# Patient Record
Sex: Male | Born: 1995 | Race: Black or African American | Hispanic: No | Marital: Single | Smoking: Current some day smoker
Health system: Southern US, Community
[De-identification: ages and names within clinical notes are randomized; demographics above are authoritative.]

---
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
## 2017-01-14 ENCOUNTER — Encounter (HOSPITAL_BASED_OUTPATIENT_CLINIC_OR_DEPARTMENT_OTHER): Payer: Self-pay | Admitting: *Deleted

## 2017-01-14 ENCOUNTER — Emergency Department (HOSPITAL_BASED_OUTPATIENT_CLINIC_OR_DEPARTMENT_OTHER): Payer: Self-pay

## 2017-01-14 ENCOUNTER — Other Ambulatory Visit: Payer: Self-pay

## 2017-01-14 ENCOUNTER — Emergency Department (HOSPITAL_BASED_OUTPATIENT_CLINIC_OR_DEPARTMENT_OTHER)
Admission: EM | Admit: 2017-01-14 | Discharge: 2017-01-14 | Disposition: A | Discharge status: Home / Self Care | Payer: Self-pay | Attending: Emergency Medicine | Admitting: Emergency Medicine

## 2017-01-14 DIAGNOSIS — N5089 Other specified disorders of the male genital organs: Secondary | ICD-10-CM

## 2017-01-14 DIAGNOSIS — I861 Scrotal varices: Secondary | ICD-10-CM | POA: Insufficient documentation

## 2017-01-14 DIAGNOSIS — N50812 Left testicular pain: Secondary | ICD-10-CM

## 2017-01-14 DIAGNOSIS — F17200 Nicotine dependence, unspecified, uncomplicated: Secondary | ICD-10-CM | POA: Insufficient documentation

## 2017-01-14 LAB — URINALYSIS, ROUTINE W REFLEX MICROSCOPIC
Bilirubin Urine: NEGATIVE
Glucose, UA: NEGATIVE mg/dL
Hgb urine dipstick: NEGATIVE
Ketones, ur: NEGATIVE mg/dL
Leukocytes, UA: NEGATIVE
Nitrite: NEGATIVE
Protein, ur: NEGATIVE mg/dL
Specific Gravity, Urine: 1.015 (ref 1.005–1.030)
pH: 8.5 — ABNORMAL HIGH (ref 5.0–8.0)

## 2017-01-14 NOTE — ED Provider Notes (Signed)
Ultrasound shows small left-sided varicocele that is likely the cause of the patient's symptoms.  However it is minimally bothersome and small.  I discussed he can have urology follow-up but he declines and was told to return if it gets worse.  Results for orders placed or performed during the hospital encounter of 01/14/17  Urinalysis, Routine w reflex microscopic  Result Value Ref Range   Color, Urine YELLOW YELLOW   APPearance CLEAR CLEAR   Specific Gravity, Urine 1.015 1.005 - 1.030   pH 8.5 (H) 5.0 - 8.0   Glucose, UA NEGATIVE NEGATIVE mg/dL   Hgb urine dipstick NEGATIVE NEGATIVE   Bilirubin Urine NEGATIVE NEGATIVE   Ketones, ur NEGATIVE NEGATIVE mg/dL   Protein, ur NEGATIVE NEGATIVE mg/dL   Nitrite NEGATIVE NEGATIVE   Leukocytes, UA NEGATIVE NEGATIVE   [place] Scrotom W/doppler  Result Date: 01/14/2017 CLINICAL DATA:  Left testicular swelling and pain for 1 month EXAM: SCROTAL ULTRASOUND DOPPLER ULTRASOUND OF THE TESTICLES TECHNIQUE: Complete ultrasound examination of the testicles, epididymis, and other scrotal structures was performed. Color and spectral Doppler ultrasound were also utilized to evaluate blood flow to the testicles. COMPARISON:  None. FINDINGS: Right testicle Measurements: 3.7 x 1.8 x 2.4 cm. No mass or microlithiasis visualized. Left testicle Measurements: 3.2 x 1.9 x 2.6 cm. No mass or microlithiasis visualized. Right epididymis:  Normal in size and appearance. Left epididymis:  Normal in size and appearance. Hydrocele:  Small hydroceles are noted bilaterally. Varicocele:  Small left varicocele. Pulsed Doppler interrogation of both testes demonstrates normal low resistance arterial and venous waveforms bilaterally. IMPRESSION: Small left hydrocele.  Normal-appearing testicles. Small bilateral hydrocele. Electronically Signed   By: Alcide CleverMark  Lukens M.D.   On: 01/14/2017 15:51      Pricilla LovelessGoldston, Azhane Eckart, MD 01/14/17 [phone]

## 2017-01-14 NOTE — ED Triage Notes (Signed)
States he noticed a knot in his left testicle a month ago. States it comes and goes.

## 2017-01-14 NOTE — ED Provider Notes (Signed)
[facility] EMERGENCY DEPARTMENT Provider Note   CSN: [id number] Arrival date & time: 01/14/17  1321     History   Chief Complaint Chief Complaint  Patient presents with  . Groin Pain    HPI Terrence DupontHenry Oley is a 21 y.o. male.  HPI   21 year old male presenting with swelling to left testicle.  He reports sometimes painful some was not.  Is been going on for several months.  Patient reports no dysuria, no discharge.  Patient reports unprotected sex but not in the last 6 weeks.  Patient reports no trauma.  No difficulty with urination.  No abdominal pain nausea vomiting or diarrhea.  History reviewed. No pertinent past medical history.  There are no active problems to display for this patient.   History reviewed. No pertinent surgical history.     Home Medications    Prior to Admission medications   Not on File    Family History No family history on file.  Social History Social History   Tobacco Use  . Smoking status: Current Every Day Smoker  . Smokeless tobacco: Never Used  Substance Use Topics  . Alcohol use: No    Frequency: Never  . Drug use: No     Allergies   Patient has no known allergies.   Review of Systems Review of Systems  Constitutional: Negative for activity change.  Respiratory: Negative for shortness of breath.   Cardiovascular: Negative for chest pain.  Gastrointestinal: Negative for abdominal pain.  Genitourinary: Positive for scrotal swelling. Negative for discharge, penile pain and penile swelling.     Physical Exam Updated Vital Signs BP 134/67   Pulse 70   Temp 98.1 F (36.7 C) (Oral)   Resp 20   Ht 5\' 11"  (1.803 m)   Wt 65.8 kg (145 lb)   SpO2 100%   BMI 20.22 kg/m   Physical Exam  Constitutional: He is oriented to person, place, and time. He appears well-nourished.  HENT:  Head: Normocephalic.  Eyes: Conjunctivae are normal.  Cardiovascular: Normal rate.  Pulmonary/Chest: Effort normal.    Abdominal: Soft. He exhibits no distension. There is no tenderness.  Genitourinary: Penis normal.  Genitourinary Comments: Swelling to the top of the last left testicle with mild pain to palpation.  Normal lie.  Neurological: He is oriented to person, place, and time.  Skin: Skin is warm and dry. He is not diaphoretic.  Psychiatric: He has a normal mood and affect. His behavior is normal.     ED Treatments / Results  Labs (all labs ordered are listed, but only abnormal results are displayed) Labs Reviewed  URINALYSIS, ROUTINE W REFLEX MICROSCOPIC - Abnormal; Notable for the following components:      Result Value   pH 8.5 (*)    All other components within normal limits    EKG  EKG Interpretation None       Radiology No results found.  Procedures Procedures (including critical care time)  Medications Ordered in ED Medications - No data to display   Initial Impression / Assessment and Plan / ED Course  I have reviewed the triage vital signs and the nursing notes.  Pertinent labs & imaging results that were available during my care of the patient were reviewed by me and considered in my medical decision making (see chart for details).     21 year old male presenting with swelling to left testicle.  He reports sometimes painful some was not.  Is been going on for several  months.  Patient reports no dysuria, no discharge.  Patient reports unprotected sex but not in the last 6 weeks.  Patient reports no trauma.  No difficulty with urination.  No abdominal pain nausea vomiting or diarrhea.  Spermatocele versus varicocele.  Versus hydrocele  Get ultrasound.  Do not suspect any kind of infection  Final Clinical Impressions(s) / ED Diagnoses   Final diagnoses:  None    ED Discharge Orders    None       Abelino DerrickMackuen, Kaiea Esselman Lyn, MD 01/16/17 2315

## 2018-06-02 IMAGING — US US SCROTUM W/ DOPPLER COMPLETE
1 series · 14 of 25 positions shown · non-contrast
Comparison: None.

CLINICAL DATA: Left testicular swelling and pain for 1 month

EXAM:
SCROTAL ULTRASOUND
DOPPLER ULTRASOUND OF THE TESTICLES
TECHNIQUE: Complete ultrasound examination of the testicles, epididymis, and
other scrotal structures was performed. Color and spectral Doppler
ultrasound were also utilized to evaluate blood flow to the
testicles.

[Series 1: us scrotum w/ doppler complete · 0.07mm/px · 14 of 70 slices shown]
[im 1/70]
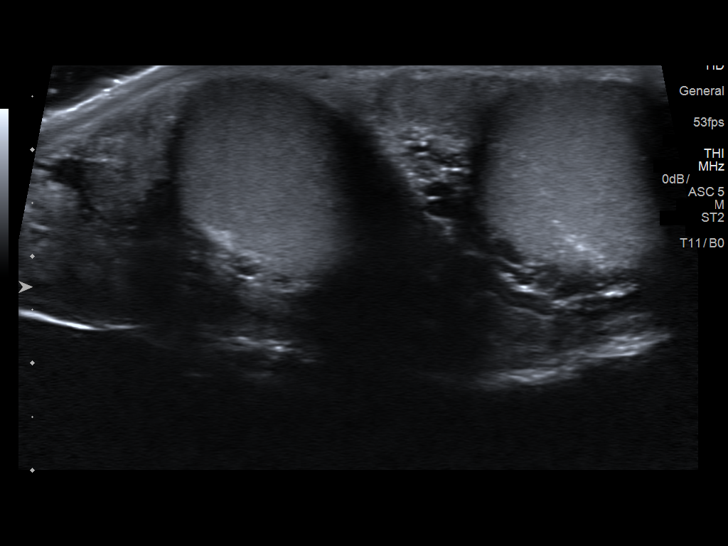
[im 6/70]
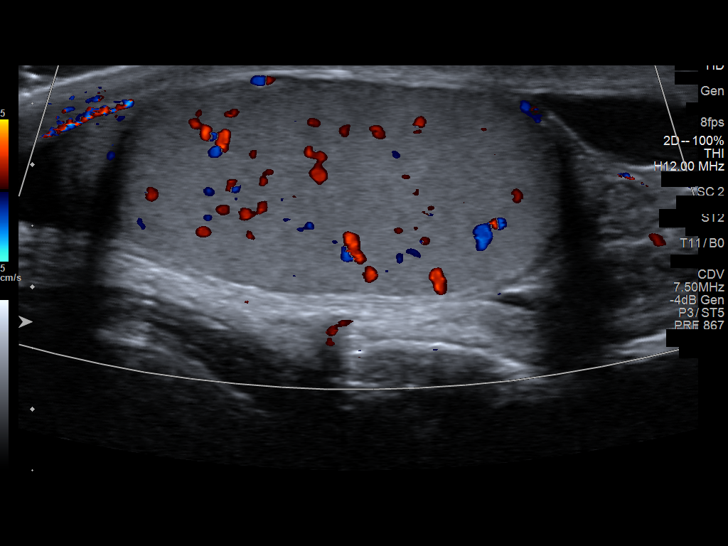
[im 12/70]
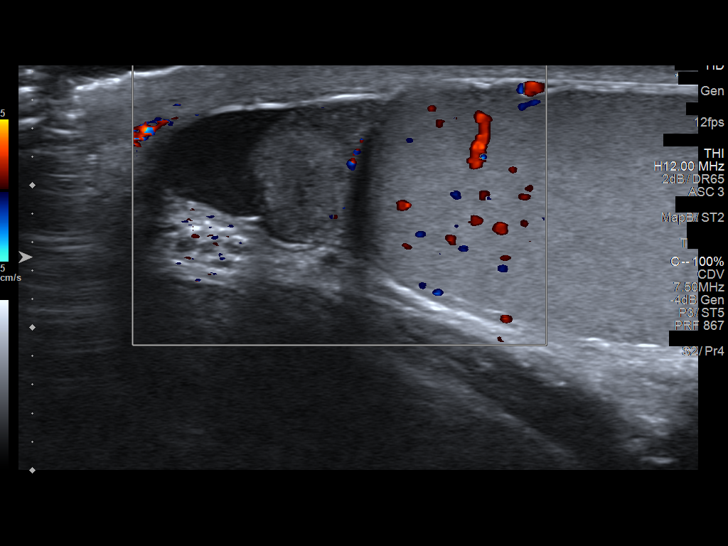
[im 18/70]
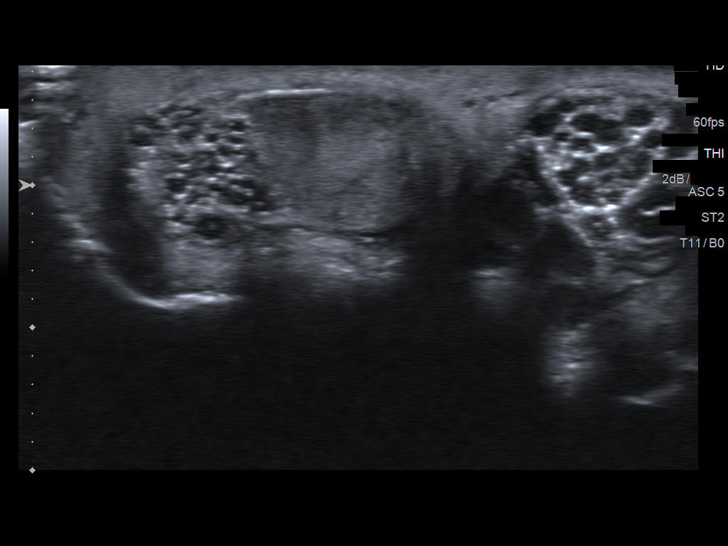
[im 24/70]
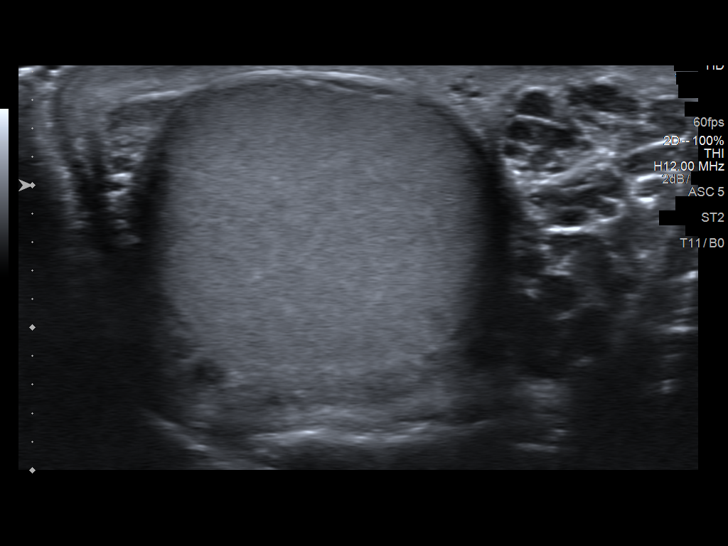
[im 26/70]
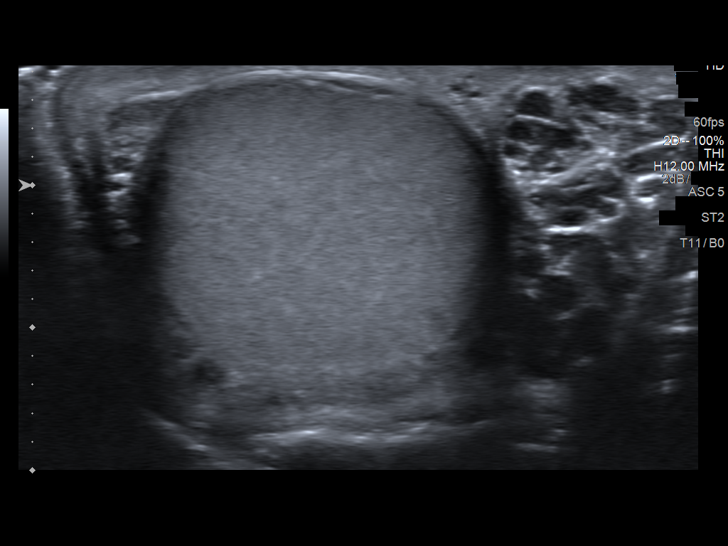
[im 32/70]
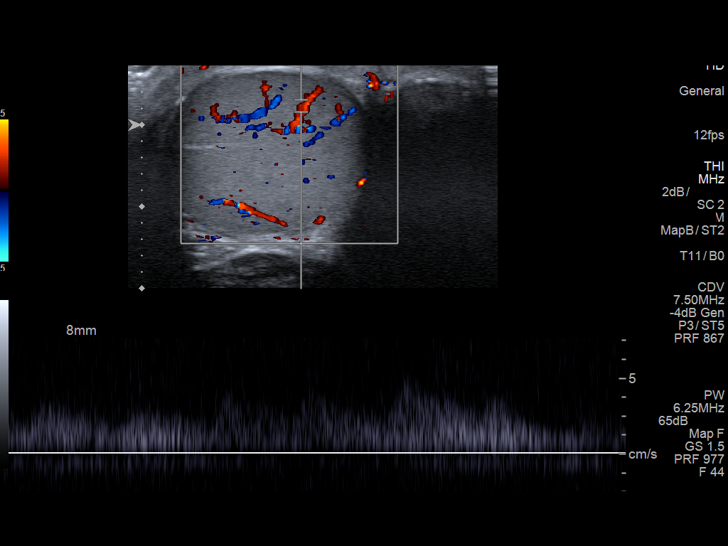
[im 38/70]
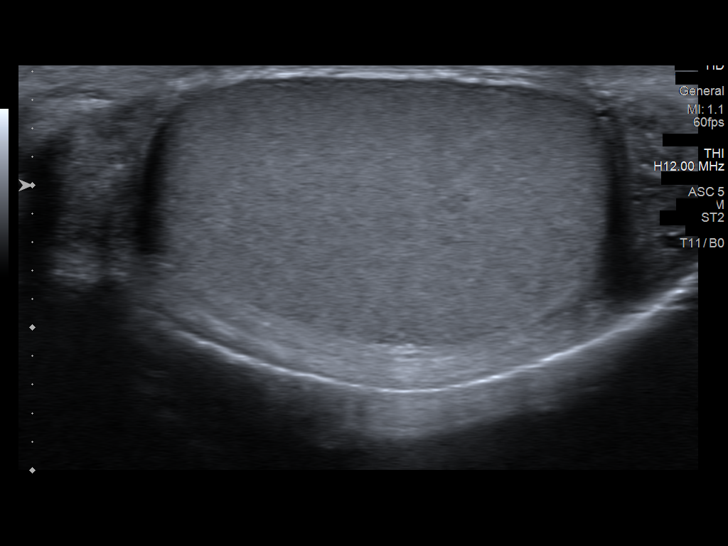
[im 44/70]
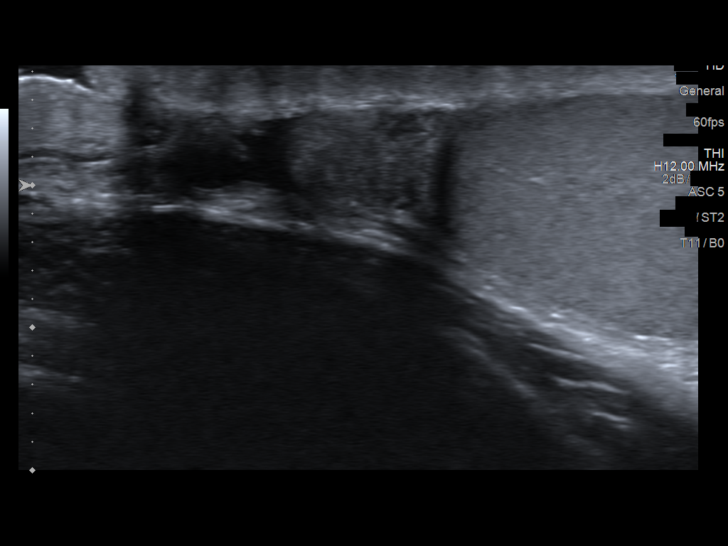
[im 47/70]
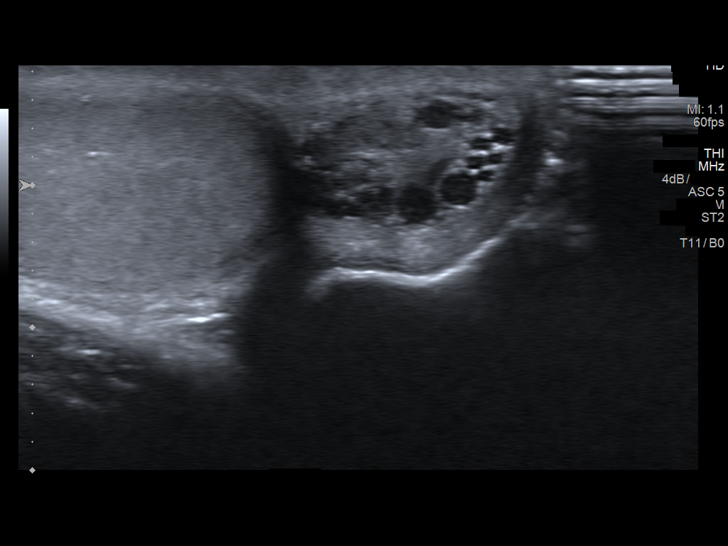
[im 52/70]
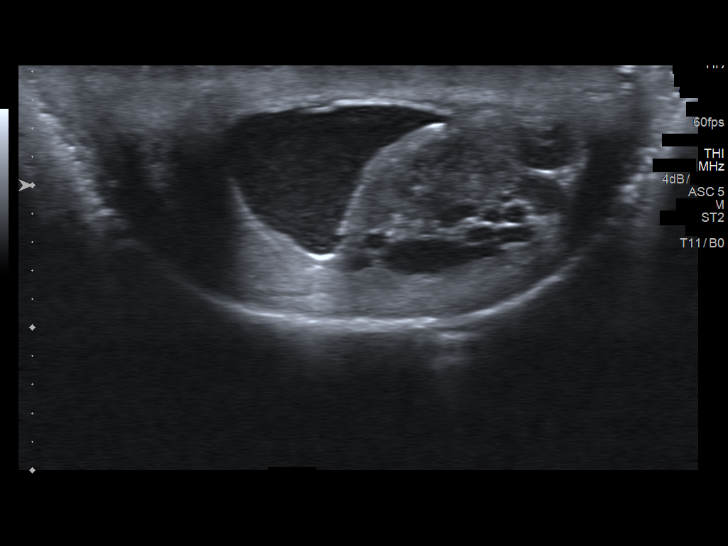
[im 58/70]
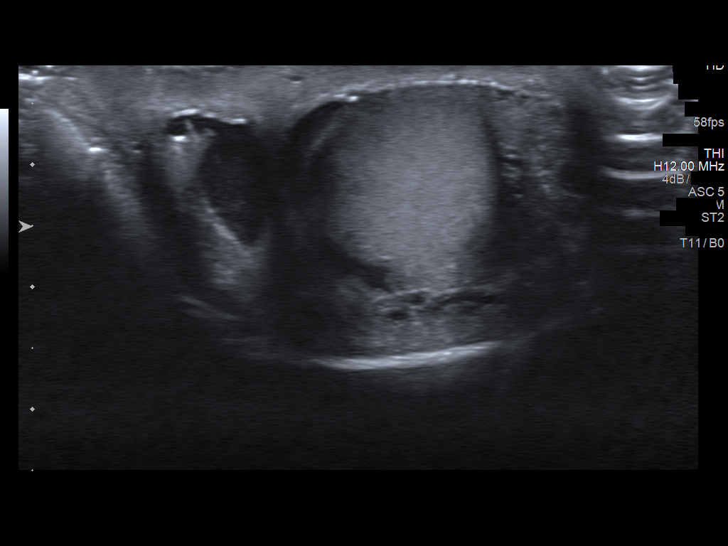
[im 64/70]
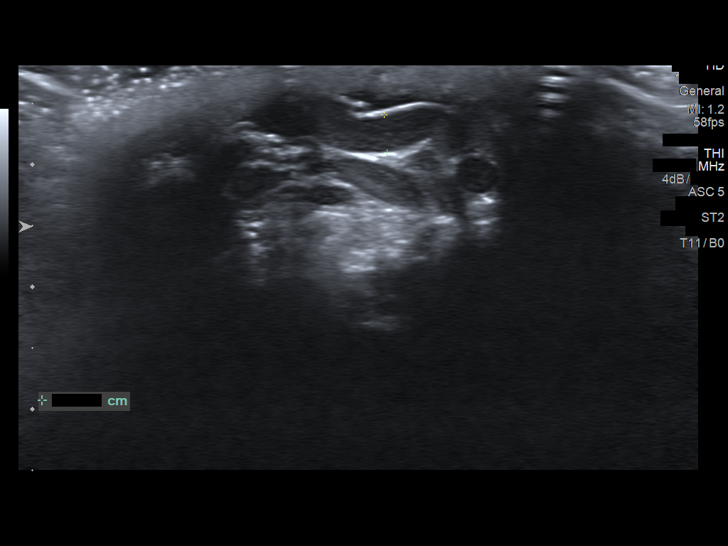
[im 70/70]
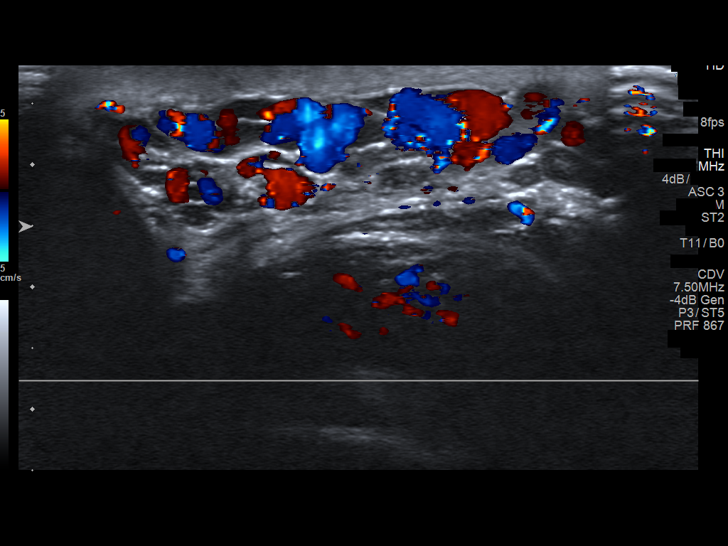

[14 of 25 positions shown; findings below may reference images not displayed]

FINDINGS: Right testicle

Measurements: 3.7 x 1.8 x 2.4 cm.. No mass or microlithiasis
visualized.

Left testicle

Measurements: 3.2 x 1.9 x 2.6 cm.. No mass or microlithiasis
visualized.

Right epididymis:  Normal in size and appearance.

Left epididymis:  Normal in size and appearance.

Hydrocele:  Small hydroceles are noted bilaterally.

Varicocele:  Small left varicocele.

Pulsed Doppler interrogation of both testes demonstrates normal low
resistance arterial and venous waveforms bilaterally.
IMPRESSION: Small left hydrocele.  Normal-appearing testicles.

Small bilateral hydrocele.

## 2018-11-08 ENCOUNTER — Other Ambulatory Visit: Payer: Self-pay

## 2018-11-08 ENCOUNTER — Emergency Department (HOSPITAL_BASED_OUTPATIENT_CLINIC_OR_DEPARTMENT_OTHER)
Admission: EM | Admit: 2018-11-08 | Discharge: 2018-11-08 | Disposition: A | Discharge status: Home / Self Care | Payer: Self-pay | Attending: Emergency Medicine | Admitting: Emergency Medicine

## 2018-11-08 ENCOUNTER — Encounter (HOSPITAL_BASED_OUTPATIENT_CLINIC_OR_DEPARTMENT_OTHER): Payer: Self-pay | Admitting: Emergency Medicine

## 2018-11-08 DIAGNOSIS — M549 Dorsalgia, unspecified: Secondary | ICD-10-CM | POA: Insufficient documentation

## 2018-11-08 DIAGNOSIS — Z5321 Procedure and treatment not carried out due to patient leaving prior to being seen by health care provider: Secondary | ICD-10-CM | POA: Insufficient documentation

## 2018-11-08 NOTE — ED Triage Notes (Signed)
Thoracic back pain x 2 weeks. "Only hurts when I smoke cigarettes"

## 2018-11-10 ENCOUNTER — Encounter (HOSPITAL_BASED_OUTPATIENT_CLINIC_OR_DEPARTMENT_OTHER): Payer: Self-pay

## 2018-11-10 ENCOUNTER — Other Ambulatory Visit: Payer: Self-pay

## 2018-11-10 ENCOUNTER — Emergency Department (HOSPITAL_BASED_OUTPATIENT_CLINIC_OR_DEPARTMENT_OTHER)
Admission: EM | Admit: 2018-11-10 | Discharge: 2018-11-10 | Disposition: A | Discharge status: Home / Self Care | Payer: Self-pay | Attending: Emergency Medicine | Admitting: Emergency Medicine

## 2018-11-10 ENCOUNTER — Emergency Department (HOSPITAL_BASED_OUTPATIENT_CLINIC_OR_DEPARTMENT_OTHER): Payer: Self-pay

## 2018-11-10 DIAGNOSIS — R0981 Nasal congestion: Secondary | ICD-10-CM

## 2018-11-10 DIAGNOSIS — F17210 Nicotine dependence, cigarettes, uncomplicated: Secondary | ICD-10-CM | POA: Insufficient documentation

## 2018-11-10 DIAGNOSIS — M62830 Muscle spasm of back: Secondary | ICD-10-CM | POA: Insufficient documentation

## 2018-11-10 MED ORDER — FLUTICASONE PROPIONATE 50 MCG/ACT NA SUSP: 2.0000 | Freq: Every day | NASAL | 0 refills | Status: AC

## 2018-11-10 MED ORDER — IBUPROFEN 600 MG PO TABS: 600.0000 mg | ORAL_TABLET | Freq: Four times a day (QID) | ORAL | 0 refills | Status: AC | PRN

## 2018-11-10 NOTE — ED Provider Notes (Addendum)
[facility] EMERGENCY DEPARTMENT Provider Note   CSN: [id number] Arrival date & time: 11/10/18  2101     History   Chief Complaint Chief Complaint  Patient presents with  . Back Pain    HPI Alejandro Price is a 23 y.o. male.     HPI  Pt is a 23 y/o male who presents to the ED today c/o right upper back pain that began about 5 days ago. He also reports that when he smokes cigarettes his throat swells up and he gets mucous in this throat and nasal congestion.  He also states that it hurts his throat when he smokes cigarettes.  He does not have a sore throat when he does not smoke cigarettes.  Denies cough, shortness of breath, chest pain, or pain with inspiration. He took tylenol yesterday which helped his symptoms.   States that he thinks he has an upper respiratory infection because this is what happened to him last year when he was diagnosed with a URI.  Denies leg pain/swelling, hemoptysis, recent surgery, recent long travel, hormone use, personal hx of cancer, or hx of DVT/PE.   History reviewed. No pertinent past medical history.  There are no active problems to display for this patient.  History reviewed. No pertinent surgical history.    Home Medications    Prior to Admission medications   Medication Sig Start Date End Date Taking? Authorizing Provider  cetirizine-pseudoephedrine (ZYRTEC-D) 5-120 MG tablet Take 1 tablet by mouth daily. 11/12/18   Law, Bea Graff, PA-C  fluticasone (FLONASE) 50 MCG/ACT nasal spray Place 2 sprays into both nostrils daily. 11/10/18   Venesa Semidey S, PA-C    Family History No family history on file.  Social History Social History   Tobacco Use  . Smoking status: Current Every Day Smoker    Types: Cigarettes  . Smokeless tobacco: Never Used  Substance Use Topics  . Alcohol use: No    Frequency: Never  . Drug use: No     Allergies   Patient has no known allergies.   Review of Systems Review of Systems   Constitutional: Negative for fever.  HENT: Positive for congestion and postnasal drip. Negative for rhinorrhea.   Respiratory: Negative for cough and shortness of breath.   Cardiovascular: Negative for chest pain.  Gastrointestinal: Negative for abdominal pain, constipation, diarrhea, nausea and vomiting.  Musculoskeletal: Positive for back pain.  Neurological: Negative for headaches.    Physical Exam Updated Vital Signs BP (!) 149/88 (BP Location: Right Arm)   Pulse 76   Temp 98.5 F (36.9 C) (Oral)   Resp 16   Ht 5\' 11"  (1.803 m)   Wt 63.5 kg   SpO2 99%   BMI 19.53 kg/m   Physical Exam Vitals signs and nursing note reviewed.  Constitutional:      Appearance: He is well-developed.  HENT:     Head: Normocephalic and atraumatic.     Nose: Nose normal. No congestion or rhinorrhea.     Mouth/Throat:     Mouth: Mucous membranes are moist.     Pharynx: No oropharyngeal exudate or posterior oropharyngeal erythema.  Eyes:     Extraocular Movements: Extraocular movements intact.     Conjunctiva/sclera: Conjunctivae normal.     Pupils: Pupils are equal, round, and reactive to light.  Neck:     Musculoskeletal: Neck supple.  Cardiovascular:     Rate and Rhythm: Normal rate and regular rhythm.     Pulses: Normal pulses.  Heart sounds: Normal heart sounds. No murmur.  Pulmonary:     Effort: Pulmonary effort is normal. No respiratory distress.     Breath sounds: Normal breath sounds. No wheezing, rhonchi or rales.  Abdominal:     General: Bowel sounds are normal. There is no distension.     Palpations: Abdomen is soft.     Tenderness: There is no abdominal tenderness. There is no guarding or rebound.  Musculoskeletal:     Comments: TTP to the right upper back along the thoracic paraspinous muscles that reproduces the pain  Skin:    General: Skin is warm and dry.  Neurological:     Mental Status: He is alert.      ED Treatments / Results  Labs (all labs ordered are  listed, but only abnormal results are displayed) Labs Reviewed - No data to display  EKG None  Radiology No results found.  Procedures Procedures (including critical care time)  Medications Ordered in ED Medications - No data to display   Initial Impression / Assessment and Plan / ED Course  I have reviewed the triage vital signs and the nursing notes.  Pertinent labs & imaging results that were available during my care of the patient were reviewed by me and considered in my medical decision making (see chart for details).   Final Clinical Impressions(s) / ED Diagnoses   Final diagnoses:  Muscle spasm of back  Nasal congestion   Pt is a 23 y/o male who presents to the ED today c/o right upper back pain and nasal congestion/sore throat when smoking cigarettes.   On exam, lungs are clear to auscultation bilaterally.  Heart with regular rate and rhythm.  I can reproduce his right upper back pain by palpating his upper thoracic paraspinous muscles.  He has no rashes.  He has no pharyngeal erythema/edema or exudates.  No cervical adenopathy.  He has no risk factors for PE, PERC negative, very low suspicion for this.  He is very concerned that he has pneumonia.  Chest x-ray is negative for this.  I offered COVID testing however he refused because he was worried that it would hurt his nose.  I discussed smoking cessation and that he is likely having pain in his throat from the hot smoke.  I also discussed that I think his right upper back pain is related to a muscle spasm.  With regard to his nasal congestion, he may have viral URI.  Will prescribe fluticasone for nasal congestion and ibuprofen for his back pain.  Have advised PCP follow-up.  I will also give follow-up to ENT for his concerns of his throat.  Advised return to ED for new or worsening symptoms.  Voiced understand the plan and reasons to return.  All questions answered patient stable for discharge.    ED Discharge Orders          Ordered    fluticasone (FLONASE) 50 MCG/ACT nasal spray  Daily     11/10/18 2207    ibuprofen (ADVIL) 600 MG tablet  Every 6 hours PRN     11/10/18 2207           Karrie Meres, PA-C 11/10/18 2211    Terrilee Files, MD 11/10/18 2215    Samson Frederic, Evamae Rowen S, PA-C 11/18/18 1506    Terrilee Files, MD 11/19/18 [phone]

## 2018-11-10 NOTE — ED Triage Notes (Addendum)
Pt c/o upper back pain-clears throat and has "mucus"-denies as cough-no fever-per pt seen at [facility] ED 2 days ago-dx with "pulled muscle"-NAD-steady gait

## 2018-11-10 NOTE — Discharge Instructions (Addendum)
You may alternate taking Tylenol and Ibuprofen as needed for pain control. You may take 400-600 mg of ibuprofen every 6 hours and [phone] mg of Tylenol every 6 hours. Do not exceed 4000 mg of Tylenol daily as this can lead to liver damage. Also, make sure to take Ibuprofen with meals as it can cause an upset stomach. Do not take other NSAIDs while taking Ibuprofen such as (Aleve, Naprosyn, Aspirin, Celebrex, etc) and do not take more than the prescribed dose as this can lead to ulcers and bleeding in your GI tract. You may use warm and cold compresses to help with your symptoms.   Use the nasal spray as directed.  Please follow up with your primary doctor within the next 7-10 days for re-evaluation and further treatment of your symptoms. You were also given information to follow up with ENT for your throat symptoms.   Please return to the ER sooner if you have any new or worsening symptoms.

## 2018-11-12 ENCOUNTER — Other Ambulatory Visit: Payer: Self-pay

## 2018-11-12 ENCOUNTER — Encounter (HOSPITAL_BASED_OUTPATIENT_CLINIC_OR_DEPARTMENT_OTHER): Payer: Self-pay

## 2018-11-12 ENCOUNTER — Emergency Department (HOSPITAL_BASED_OUTPATIENT_CLINIC_OR_DEPARTMENT_OTHER)
Admission: EM | Admit: 2018-11-12 | Discharge: 2018-11-12 | Disposition: A | Discharge status: Home / Self Care | Payer: Self-pay | Attending: Emergency Medicine | Admitting: Emergency Medicine

## 2018-11-12 DIAGNOSIS — F17210 Nicotine dependence, cigarettes, uncomplicated: Secondary | ICD-10-CM | POA: Insufficient documentation

## 2018-11-12 DIAGNOSIS — Z20828 Contact with and (suspected) exposure to other viral communicable diseases: Secondary | ICD-10-CM | POA: Insufficient documentation

## 2018-11-12 DIAGNOSIS — R0982 Postnasal drip: Secondary | ICD-10-CM | POA: Insufficient documentation

## 2018-11-12 LAB — GROUP A STREP BY PCR: Group A Strep by PCR: NOT DETECTED

## 2018-11-12 MED ORDER — CETIRIZINE-PSEUDOEPHEDRINE ER 5-120 MG PO TB12: 1.0000 | ORAL_TABLET | Freq: Every day | ORAL | 0 refills | Status: AC

## 2018-11-12 NOTE — ED Provider Notes (Signed)
[facility] EMERGENCY DEPARTMENT Provider Note   CSN: [id number] Arrival date & time: 11/12/18  1300     History   Chief Complaint Chief Complaint  Patient presents with  . URI    HPI Alejandro Price is a 23 y.o. male who presents with a one-week history of postnasal drip and spitting up sputum.  He denies any cough, fever, ear pain.  Patient was seen 2 days ago and given Flonase and ibuprofen, however he has not filled his prescriptions.  He did say he quit smoking after he was told 2 days ago.  He denies any abdominal pain, nausea, vomiting.  He denies any known sick contacts.  He would like to be tested for COVID-19 and "other infections."     HPI  History reviewed. No pertinent past medical history.  There are no active problems to display for this patient.   History reviewed. No pertinent surgical history.      Home Medications    Prior to Admission medications   Medication Sig Start Date End Date Taking? Authorizing Provider  cetirizine-pseudoephedrine (ZYRTEC-D) 5-120 MG tablet Take 1 tablet by mouth daily. 11/12/18   Brydan Downard, Waylan Boga, PA-C  fluticasone (FLONASE) 50 MCG/ACT nasal spray Place 2 sprays into both nostrils daily. 11/10/18   Couture, Cortni S, PA-C  ibuprofen (ADVIL) 600 MG tablet Take 1 tablet (600 mg total) by mouth every 6 (six) hours as needed for up to 5 days. 11/10/18 11/15/18  Couture, Cortni S, PA-C    Family History History reviewed. No pertinent family history.  Social History Social History   Tobacco Use  . Smoking status: Current Every Day Smoker    Types: Cigarettes  . Smokeless tobacco: Never Used  Substance Use Topics  . Alcohol use: No    Frequency: Never  . Drug use: No     Allergies   Patient has no known allergies.   Review of Systems Review of Systems  Constitutional: Negative for fever.  HENT: Positive for congestion, postnasal drip and sore throat.   Respiratory: Negative for cough and shortness of breath.    Gastrointestinal: Negative for abdominal pain, nausea and vomiting.     Physical Exam Updated Vital Signs BP (!) 141/87 (BP Location: Right Arm)   Pulse 69   Temp 99.2 F (37.3 C) (Oral)   Resp 16   Ht 6\' 1"  (1.854 m)   Wt 63.5 kg   SpO2 98%   BMI 18.47 kg/m   Physical Exam Vitals signs and nursing note reviewed.  Constitutional:      General: He is not in acute distress.    Appearance: He is well-developed. He is not diaphoretic.  HENT:     Head: Normocephalic and atraumatic.     Nose: Congestion present.     Right Turbinates: Swollen (pink).     Left Turbinates: Swollen (pink).     Mouth/Throat:     Pharynx: Posterior oropharyngeal erythema present. No oropharyngeal exudate.  Eyes:     General: No scleral icterus.       Right eye: No discharge.        Left eye: No discharge.     Conjunctiva/sclera: Conjunctivae normal.     Pupils: Pupils are equal, round, and reactive to light.  Neck:     Musculoskeletal: Normal range of motion and neck supple.     Thyroid: No thyromegaly.  Cardiovascular:     Rate and Rhythm: Normal rate and regular rhythm.     Heart  sounds: Normal heart sounds. No murmur. No friction rub. No gallop.   Pulmonary:     Effort: Pulmonary effort is normal. No respiratory distress.     Breath sounds: Normal breath sounds. No stridor. No wheezing or rales.  Abdominal:     General: Bowel sounds are normal. There is no distension.     Palpations: Abdomen is soft.     Tenderness: There is no abdominal tenderness. There is no guarding or rebound.  Lymphadenopathy:     Cervical: No cervical adenopathy.  Skin:    General: Skin is warm and dry.     Coloration: Skin is not pale.     Findings: No rash.  Neurological:     Mental Status: He is alert.     Coordination: Coordination normal.      ED Treatments / Results  Labs (all labs ordered are listed, but only abnormal results are displayed) Labs Reviewed  GROUP A STREP BY PCR  NOVEL  CORONAVIRUS, NAA (HOSP ORDER, SEND-OUT TO REF LAB; TAT 18-24 HRS)    EKG None  Radiology Dg Chest Portable 1 View  Result Date: 11/10/2018 CLINICAL DATA:  Upper back pain. EXAM: PORTABLE CHEST 1 VIEW COMPARISON:  Chest x-ray dated November 08, 2018. FINDINGS: The heart size and mediastinal contours are within normal limits. Both lungs are clear. The visualized skeletal structures are unremarkable. IMPRESSION: No active disease. Electronically Signed   By: Titus Dubin M.D.   On: 11/10/2018 22:01    Procedures Procedures (including critical care time)  Medications Ordered in ED Medications - No data to display   Initial Impression / Assessment and Plan / ED Course  I have reviewed the triage vital signs and the nursing notes.  Pertinent labs & imaging results that were available during my care of the patient were reviewed by me and considered in my medical decision making (see chart for details).        Patient returning after not filling prescriptions following previous visit 2 days ago with ongoing sore throat and postnasal drip.  He has had associated congestion, but no cough or fever.  Patient is requesting "testing for infections."  Patient had a negative chest x-ray 2 days ago.  Strep is negative.  COVID-19 pending.  I suspect patient is actually having allergic rhinitis.  Will treat with Flonase as prescribed earlier and Zyrtec.  Also advised nasal saline.  Return precautions discussed.  Patient understands and agrees with plan.  Patient vitals stable throughout ED course and discharged in satisfactory condition.  Alejandro Price was evaluated in Emergency Department on 11/12/2018 for the symptoms described in the history of present illness. He was evaluated in the context of the global COVID-19 pandemic, which necessitated consideration that the patient might be at risk for infection with the SARS-CoV-2 virus that causes COVID-19. Institutional protocols and algorithms that pertain  to the evaluation of patients at risk for COVID-19 are in a state of rapid change based on information released by regulatory bodies including the CDC and federal and state organizations. These policies and algorithms were followed during the patient's care in the ED.   Final Clinical Impressions(s) / ED Diagnoses   Final diagnoses:  Post-nasal drip    ED Discharge Orders         Ordered    cetirizine-pseudoephedrine (ZYRTEC-D) 5-120 MG tablet  Daily     11/12/18 1637           Frederica Kuster, [place] 11/12/18 1727  Virgina NorfolkCuratolo, Adam, DO 11/12/18 2240

## 2018-11-12 NOTE — ED Triage Notes (Addendum)
Pt c/o sore throat, post nasal drip, and spitting up sputum x 1 week. Pt was seen here 2 days prior and given a Rx for ibuprofen and Flonase. Pt did not fill the prescription and does not understand why he is not getting any better.

## 2018-11-12 NOTE — Discharge Instructions (Addendum)
I suspect you may have seasonal allergies.  Use Flonase as prescribed to yesterday.  We will add Zyrtec as well.  You can also use nasal saline as prescribed over-the-counter throughout the day.  Please return the emergency department if you develop any new or worsening symptoms. You will only be called if you test positive for COVID-19.  Otherwise, you can check my chart for your results.

## 2018-11-12 NOTE — ED Notes (Signed)
Pt left before signing discharge paperwork.  

## 2018-11-14 LAB — NOVEL CORONAVIRUS, NAA (HOSP ORDER, SEND-OUT TO REF LAB; TAT 18-24 HRS): SARS-CoV-2, NAA: NOT DETECTED

## 2019-08-20 ENCOUNTER — Encounter (HOSPITAL_BASED_OUTPATIENT_CLINIC_OR_DEPARTMENT_OTHER): Payer: Self-pay | Admitting: Emergency Medicine

## 2019-08-20 ENCOUNTER — Other Ambulatory Visit: Payer: Self-pay

## 2019-08-20 ENCOUNTER — Emergency Department (HOSPITAL_BASED_OUTPATIENT_CLINIC_OR_DEPARTMENT_OTHER)
Admission: EM | Admit: 2019-08-20 | Discharge: 2019-08-20 | Disposition: A | Discharge status: Home / Self Care | Payer: Self-pay | Attending: Emergency Medicine | Admitting: Emergency Medicine

## 2019-08-20 DIAGNOSIS — W540XXA Bitten by dog, initial encounter: Secondary | ICD-10-CM | POA: Insufficient documentation

## 2019-08-20 DIAGNOSIS — S61250A Open bite of right index finger without damage to nail, initial encounter: Secondary | ICD-10-CM | POA: Insufficient documentation

## 2019-08-20 DIAGNOSIS — F17210 Nicotine dependence, cigarettes, uncomplicated: Secondary | ICD-10-CM | POA: Insufficient documentation

## 2019-08-20 DIAGNOSIS — Y929 Unspecified place or not applicable: Secondary | ICD-10-CM | POA: Insufficient documentation

## 2019-08-20 DIAGNOSIS — Y999 Unspecified external cause status: Secondary | ICD-10-CM | POA: Insufficient documentation

## 2019-08-20 DIAGNOSIS — Y939 Activity, unspecified: Secondary | ICD-10-CM | POA: Insufficient documentation

## 2019-08-20 MED ORDER — TETANUS-DIPHTH-ACELL PERTUSSIS 5-2.5-18.5 LF-MCG/0.5 IM SUSP
0.5000 mL | Freq: Once | INTRAMUSCULAR | Status: AC
  Administered 2019-08-20: 0.5 mL via INTRAMUSCULAR
  Filled 2019-08-20: qty 0.5

## 2019-08-20 MED ORDER — AMOXICILLIN-POT CLAVULANATE 875-125 MG PO TABS: 1.0000 | ORAL_TABLET | Freq: Two times a day (BID) | ORAL | 0 refills | Status: AC

## 2019-08-20 NOTE — ED Notes (Signed)
AVS reviewed with pt , also provided wound care teaching, discussed s/s of infection. Discussed the Rx for ABX by the EDP and importance of taking and completing medications. Opportunity for questions provided

## 2019-08-20 NOTE — ED Triage Notes (Signed)
Pt was playing with his dog when his finger got caught on its tooth causing a cut to right pointer finger.  Pt reports dog is not up to date on shots.

## 2019-08-20 NOTE — Discharge Instructions (Signed)
Return to the emergency department if any worsening signs of infection.

## 2019-08-20 NOTE — ED Notes (Signed)
Received a puppy bite to rt index finger, states dog has not had any vaccines at this time, states puppies are 32 weeks old. EDP at bedside to discuss plan of care. No active bleeding noted. Very short irregular cut type area noted on index finger.

## 2019-08-20 NOTE — ED Provider Notes (Signed)
[facility] EMERGENCY DEPARTMENT Provider Note   CSN: [id number] Arrival date & time: 08/20/19  1117     History Chief Complaint  Patient presents with  . Animal Bite    Alejandro Price is a 24 y.o. male.  Patient bit by a new puppy in the right index finger.  Dog has not been able to have the shots yet.  Dogs are doing well.  He was just playing with them and one dog bit him.  Bleeding controlled.  Unknown if tetanus.  The history is provided by the patient.  Animal Bite Contact animal:  Dog Location:  Finger Finger injury location:  R index finger Pain details:    Quality:  Aching   Severity:  Mild Incident location:  Home Animal's rabies vaccination status:  Never received Animal in possession: yes   Tetanus status:  Unknown Relieved by:  Nothing Worsened by:  Nothing Associated symptoms: no numbness and no rash        History reviewed. No pertinent past medical history.  There are no problems to display for this patient.   History reviewed. No pertinent surgical history.     No family history on file.  Social History   Tobacco Use  . Smoking status: Current Some Day Smoker    Types: Cigarettes  . Smokeless tobacco: Never Used  Vaping Use  . Vaping Use: Never used  Substance Use Topics  . Alcohol use: Yes  . Drug use: No    Home Medications Prior to Admission medications   Medication Sig Start Date End Date Taking? Authorizing Provider  amoxicillin-clavulanate (AUGMENTIN) 875-125 MG tablet Take 1 tablet by mouth 2 (two) times daily for 10 days. 08/20/19 08/30/19  Sherlock Nancarrow, DO  cetirizine-pseudoephedrine (ZYRTEC-D) 5-120 MG tablet Take 1 tablet by mouth daily. 11/12/18   Law, Waylan Boga, PA-C  fluticasone (FLONASE) 50 MCG/ACT nasal spray Place 2 sprays into both nostrils daily. 11/10/18   Couture, Cortni S, PA-C    Allergies    Patient has no known allergies.  Review of Systems   Review of Systems  Skin: Positive for wound.  Negative for color change, pallor and rash.  Neurological: Negative for weakness and numbness.    Physical Exam Updated Vital Signs BP 133/89 (BP Location: Left Arm)   Pulse 75   Temp 98.7 F (37.1 C) (Oral)   Resp 18   SpO2 100%   Physical Exam Vitals and nursing note reviewed.  Constitutional:      General: He is not in acute distress.    Appearance: He is not ill-appearing.  Musculoskeletal:        General: No swelling, tenderness or deformity. Normal range of motion.  Skin:    Capillary Refill: Capillary refill takes less than 2 seconds.     Comments: Superficial cut to the right index finger, hemostatic  Neurological:     General: No focal deficit present.     Mental Status: He is alert.     Sensory: No sensory deficit.     Motor: No weakness.     ED Results / Procedures / Treatments   Labs (all labs ordered are listed, but only abnormal results are displayed) Labs Reviewed - No data to display  EKG None  Radiology No results found.  Procedures Procedures (including critical care time)  Medications Ordered in ED Medications  Tdap (BOOSTRIX) injection 0.5 mL (0.5 mLs Intramuscular Given 08/20/19 1148)    ED Course  I have reviewed the triage  vital signs and the nursing notes.  Pertinent labs & imaging results that were available during my care of the patient were reviewed by me and considered in my medical decision making (see chart for details).    MDM Rules/Calculators/A&P                          Terrence Dupont patient bitten the right index finger by a new dog.  Dog is in his possession.  This is a family dog.  Has not been able to have rabies shots yet as a new puppy.  Dog has been acting normally otherwise.  Patient tetanus shot updated.  Will give Augmentin.  Patient has small superficial cut to his right index finger.  This was washed out.  Understands wound care instructions.  Animal control made aware.  Discharged in good condition.  Understands  return precautions.  This chart was dictated using voice recognition software.  Despite best efforts to proofread,  errors can occur which can change the documentation meaning.   Final Clinical Impression(s) / ED Diagnoses Final diagnoses:  Dog bite, initial encounter    Rx / DC Orders ED Discharge Orders         Ordered    amoxicillin-clavulanate (AUGMENTIN) 875-125 MG tablet  2 times daily     Discontinue  Reprint     08/20/19 1146           Virgina Norfolk, DO 08/20/19 1152

## 2020-09-02 IMAGING — DX DG CHEST 1V PORT
1 series · 1 of 1 positions shown · non-contrast
Comparison: Chest x-ray dated November 08, 2018.

CLINICAL DATA: Upper back pain.

EXAM:
PORTABLE CHEST 1 VIEW

[chest ap]
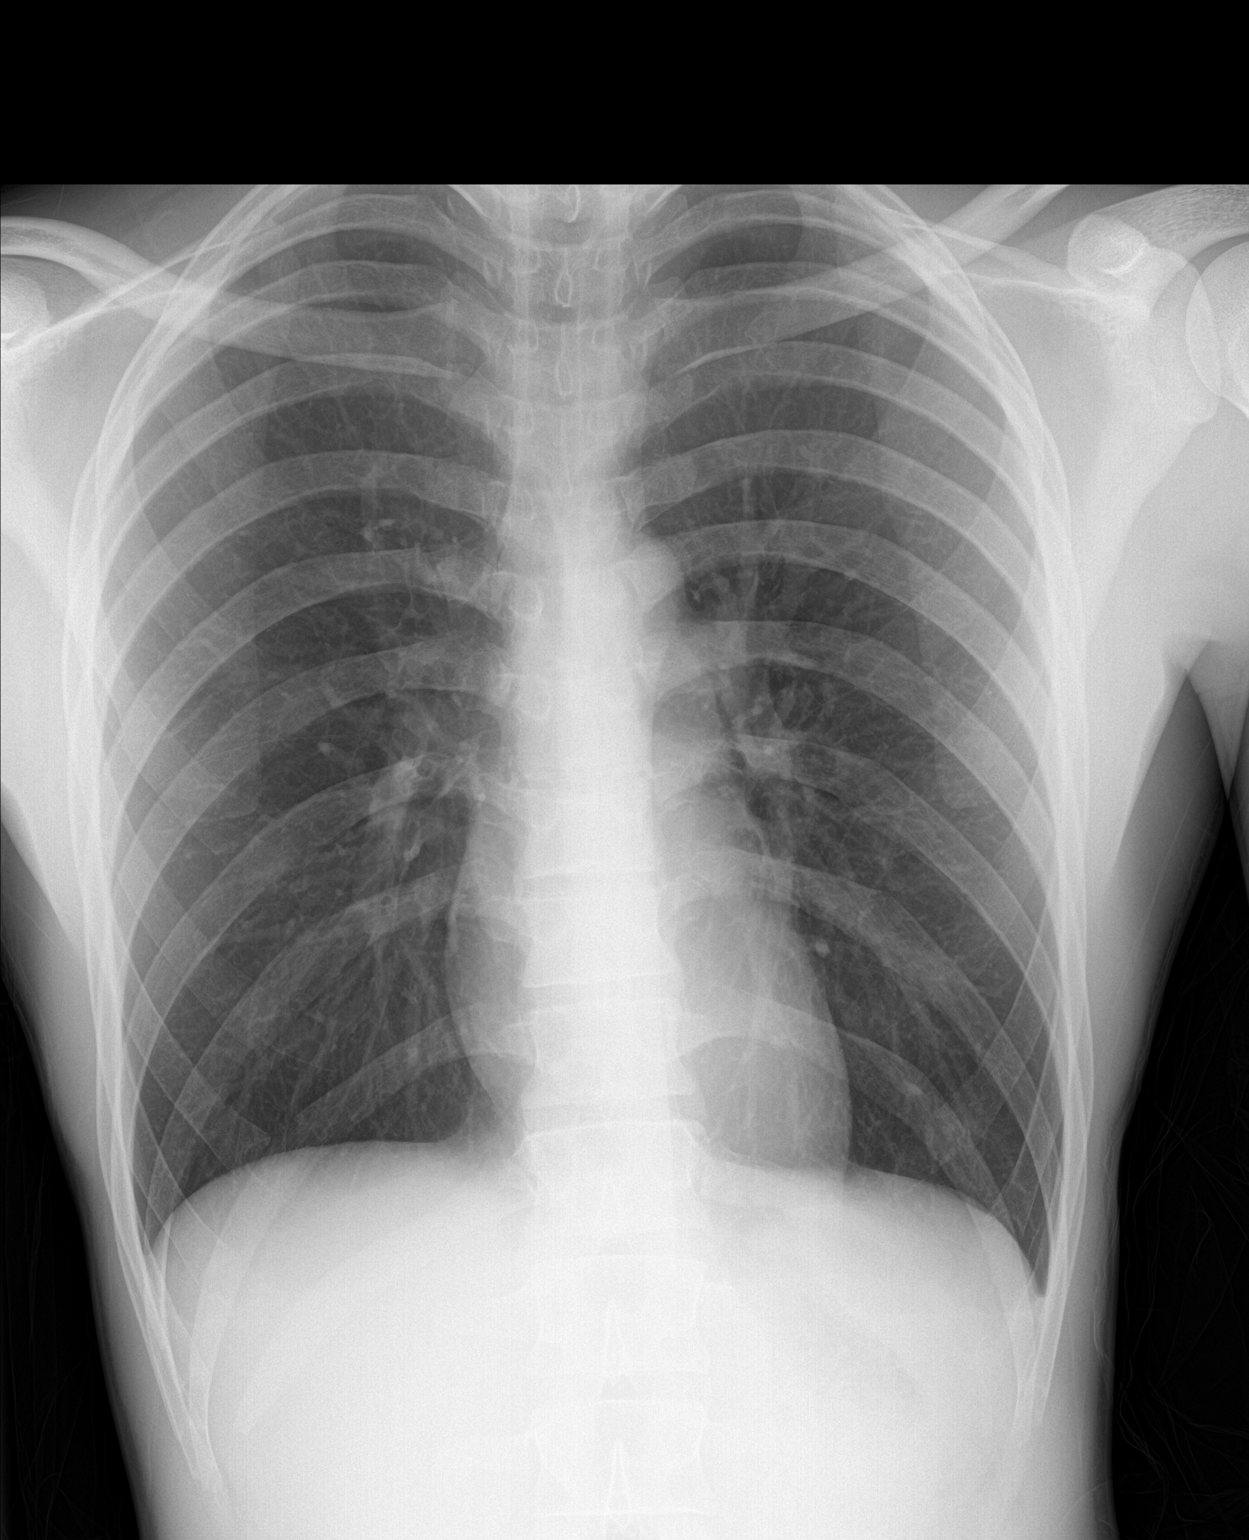

[1 of 1 positions shown; findings below may reference images not displayed]

FINDINGS: The heart size and mediastinal contours are within normal limits.
Both lungs are clear. The visualized skeletal structures are
unremarkable.
IMPRESSION: No active disease.
